# Patient Record
Sex: Female | Born: 1995 | Race: White | Hispanic: No | Marital: Single | Smoking: Never smoker
Health system: Southern US, Community
[De-identification: ages and names within clinical notes are randomized; demographics above are authoritative.]

## PROBLEM LIST (undated history)

## (undated) DIAGNOSIS — G43909 Migraine, unspecified, not intractable, without status migrainosus: Secondary | ICD-10-CM

## (undated) DIAGNOSIS — J45909 Unspecified asthma, uncomplicated: Secondary | ICD-10-CM

## (undated) HISTORY — PX: APPENDECTOMY: SHX54

## (undated) HISTORY — PX: OTHER SURGICAL HISTORY: SHX169

---
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
## 2017-01-14 IMAGING — DX DG FOOT COMPLETE 3+V*L*
3 series · 3 of 3 positions shown · non-contrast
Comparison: None.

CLINICAL DATA: Pain following fall

EXAM:
LEFT FOOT - COMPLETE 3+ VIEW

[foot ap]
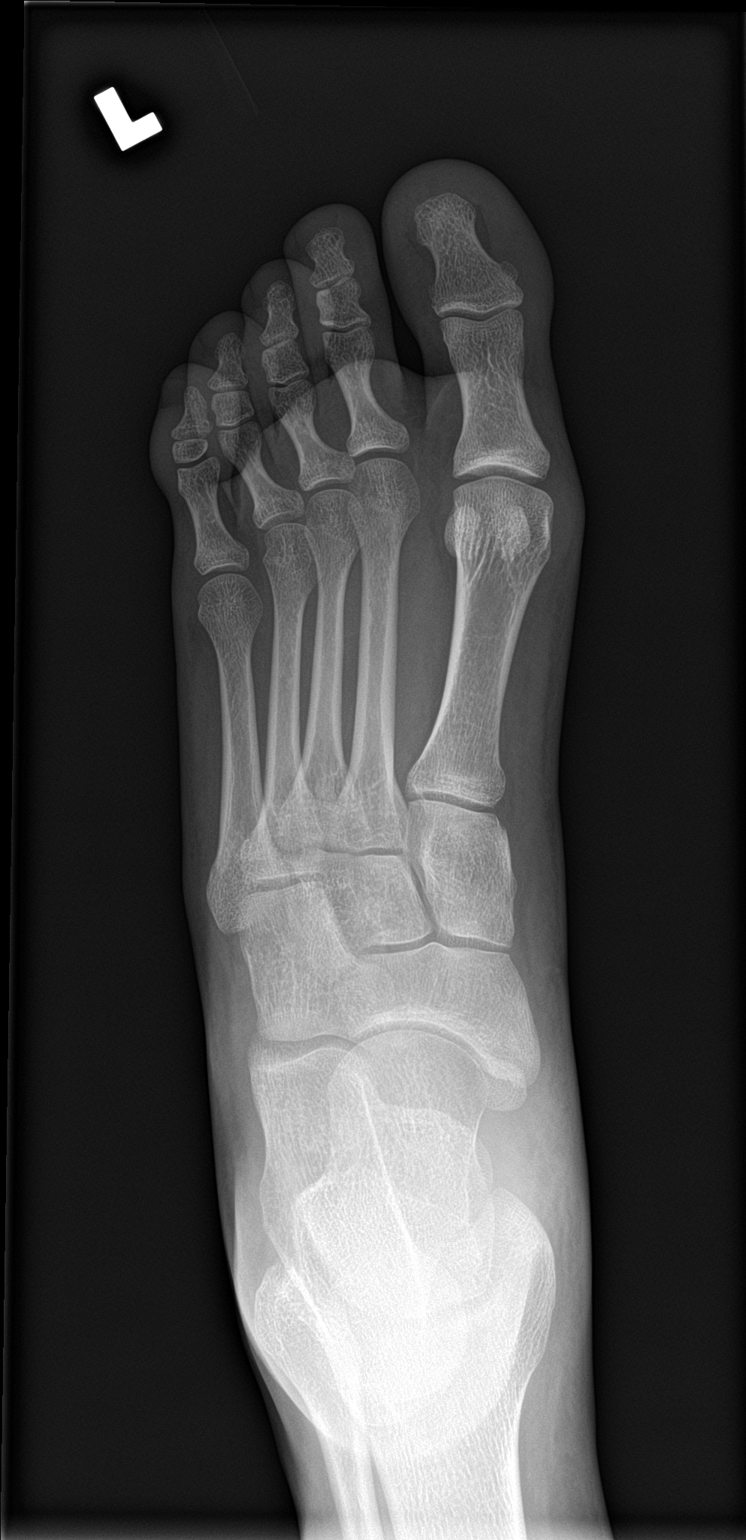

[foot obl]
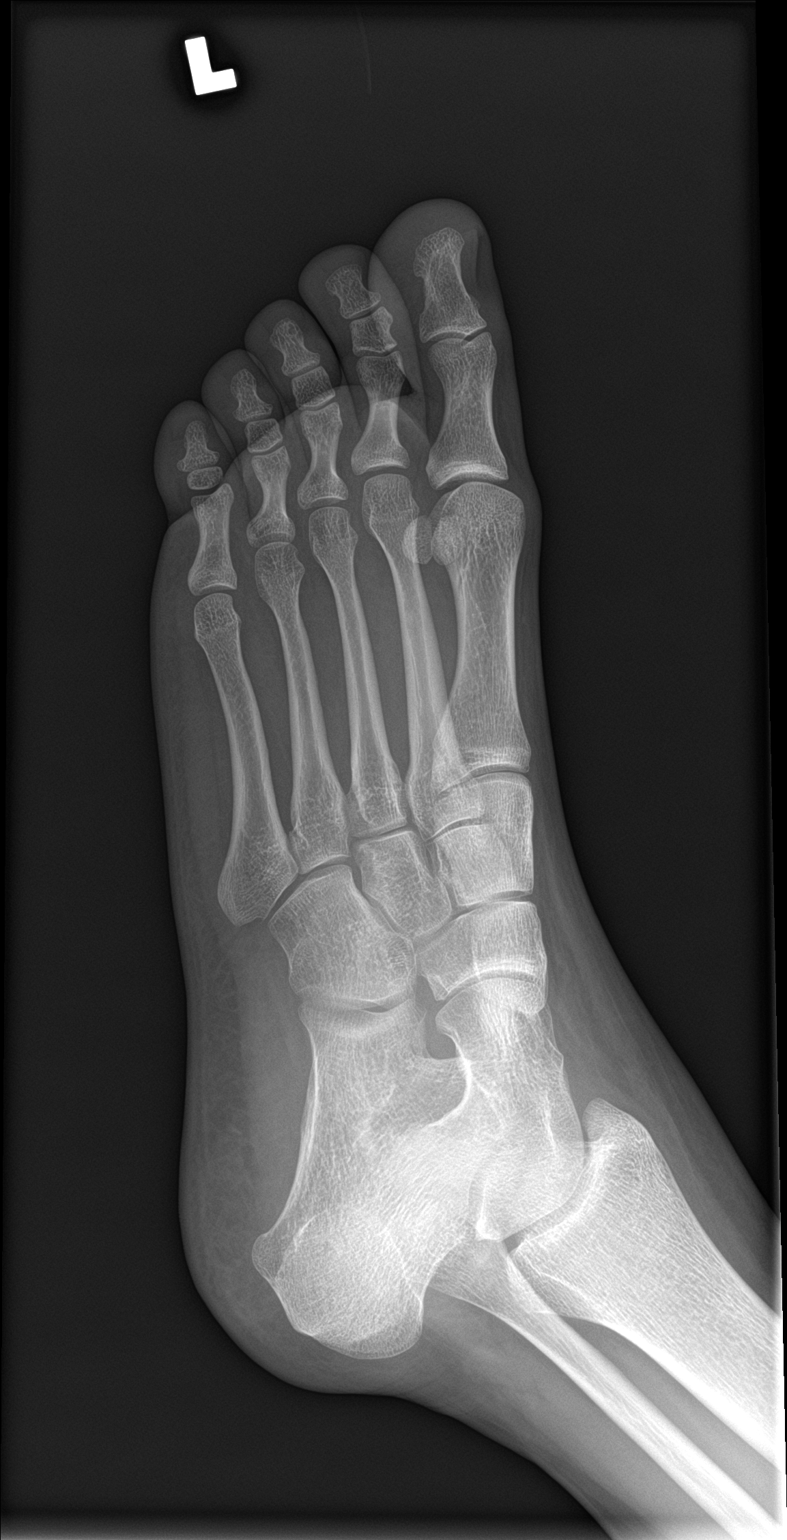

[foot lat]
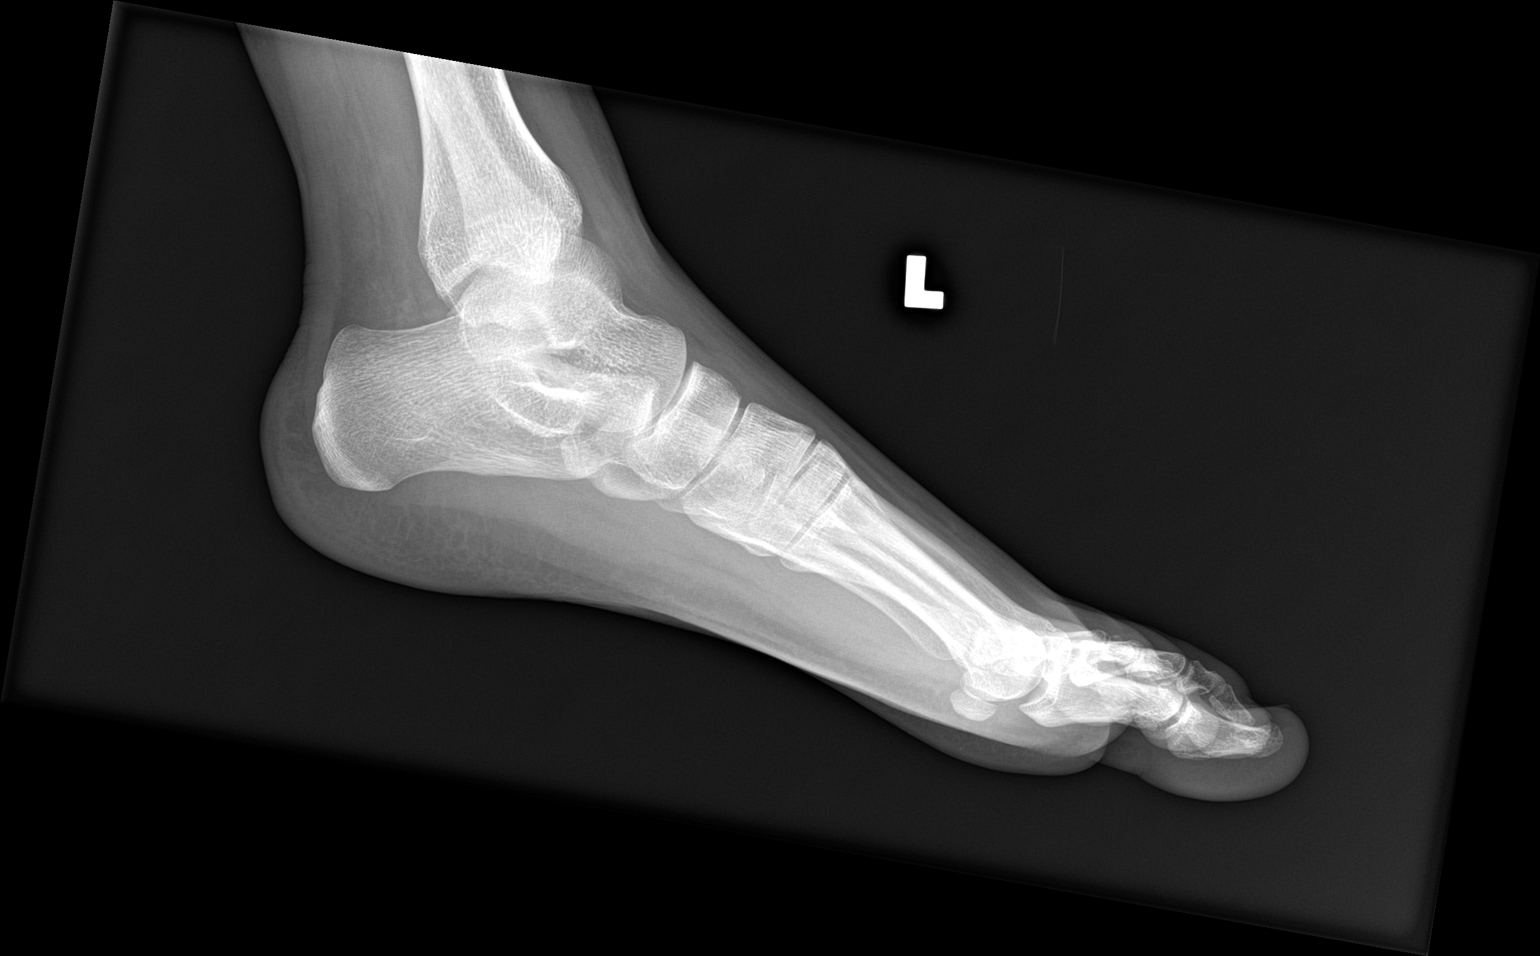

[3 of 3 positions shown; findings below may reference images not displayed]

FINDINGS: Frontal, oblique, and lateral views were obtained. There is no
demonstrable fracture or dislocation. The joint spaces appear
intact. No erosive change.
IMPRESSION: No fracture or dislocation.  No appreciable arthropathy.
